# Patient Record
Sex: Male | Born: 2000 | Race: White | Hispanic: No | Smoking: Never smoker
Health system: Southern US, Community
[De-identification: ages and names within clinical notes are randomized; demographics above are authoritative.]

---

## 2020-11-14 ENCOUNTER — Other Ambulatory Visit: Payer: Self-pay

## 2020-11-14 ENCOUNTER — Encounter: Payer: Self-pay | Admitting: Emergency Medicine

## 2020-11-14 ENCOUNTER — Ambulatory Visit
Admission: EM | Admit: 2020-11-14 | Discharge: 2020-11-14 | Disposition: A | Discharge status: Home / Self Care | Payer: BC Managed Care – PPO | Attending: Family Medicine | Admitting: Family Medicine

## 2020-11-14 DIAGNOSIS — L559 Sunburn, unspecified: Secondary | ICD-10-CM | POA: Diagnosis not present

## 2020-11-14 DIAGNOSIS — B360 Pityriasis versicolor: Secondary | ICD-10-CM

## 2020-11-14 MED ORDER — SILVER SULFADIAZINE 1 % EX CREA: 1.0000 "application " | TOPICAL_CREAM | Freq: Two times a day (BID) | CUTANEOUS | 0 refills | Status: AC

## 2020-11-14 MED ORDER — FLUCONAZOLE 150 MG PO TABS
300.0000 mg | ORAL_TABLET | ORAL | 0 refills | Status: AC
Start: 2020-11-14 — End: 2020-11-28

## 2020-11-14 MED ORDER — METHYLPREDNISOLONE SODIUM SUCC 40 MG IJ SOLR
80.0000 mg | Freq: Once | INTRAMUSCULAR | Status: AC
  Administered 2020-11-14: 80 mg via INTRAMUSCULAR

## 2020-11-14 MED ORDER — HYDROXYZINE HCL 25 MG PO TABS: 25.0000 mg | ORAL_TABLET | Freq: Three times a day (TID) | ORAL | 0 refills | Status: AC | PRN

## 2020-11-14 NOTE — ED Triage Notes (Signed)
Patient states he got a bad sunburn 2 days ago and states today the itching has been unbearable over the last 2 hours.

## 2020-11-14 NOTE — ED Provider Notes (Signed)
[facility]    CSN: [id number] Arrival date & time: 11/14/20  1728      History   Chief Complaint Chief Complaint  Patient presents with  . Sunburn   HPI 20 year old male presents with the above complaint.  Patient suffered a sunburn on Saturday.  He reports that his symptoms are now worsening.  He reports severe itching to the upper back.  He states that it is almost unbearable.  Additionally, he has ongoing issues with tinea.  He uses topical treatment and has not had a complete resolution.  He states that this worsens when he gets hot and sweats.  No relieving factors.  No other associated symptoms.  No other complaints.  Home Medications    Prior to Admission medications   Medication Sig Start Date End Date Taking? Authorizing Provider  fluconazole (DIFLUCAN) 150 MG tablet Take 2 tablets (300 mg total) by mouth once a week for 14 days. Repeat dose in 72 hours. 11/14/20 11/28/20 Yes Safira Proffit G, DO  hydrOXYzine (ATARAX/VISTARIL) 25 MG tablet Take 1 tablet (25 mg total) by mouth every 8 (eight) hours as needed. 11/14/20  Yes Brice Kossman G, DO  silver sulfADIAZINE (SILVADENE) 1 % cream Apply 1 application topically 2 (two) times daily for 7 days. 11/14/20 11/21/20 Yes Tommie Sams, DO   Social History Social History   Tobacco Use  . Smoking status: Never Smoker  . Smokeless tobacco: Never Used  Vaping Use  . Vaping Use: Never used  Substance Use Topics  . Alcohol use: Yes  . Drug use: Yes    Types: Marijuana    Allergies   Patient has no known allergies.   Review of Systems Review of Systems Per HPI  Physical Exam Triage Vital Signs ED Triage Vitals  Enc Vitals Group     BP 11/14/20 1807 116/70     Pulse Rate 11/14/20 1807 66     Resp 11/14/20 1807 18     Temp 11/14/20 1807 98 F (36.7 C)     Temp Source 11/14/20 1807 Oral     SpO2 11/14/20 1807 100 %     Weight 11/14/20 1805 170 lb (77.1 kg)     Height 11/14/20 1805 5\' 9"  (1.753 m)     Head  Circumference --      Peak Flow --      Pain Score 11/14/20 1805 0     Pain Loc --      Pain Edu? --      Excl. in GC? --    Updated Vital Signs BP 116/70 (BP Location: Right Arm)   Pulse 66   Temp 98 F (36.7 C) (Oral)   Resp 18   Ht 5\' 9"  (1.753 m)   Wt 77.1 kg   SpO2 100%   BMI 25.10 kg/m   Visual Acuity Right Eye Distance:   Left Eye Distance:   Bilateral Distance:    Right Eye Near:   Left Eye Near:    Bilateral Near:     Physical Exam Vitals and nursing note reviewed.  Constitutional:      General: He is not in acute distress.    Appearance: Normal appearance. He is not ill-appearing.  HENT:     Head: Normocephalic and atraumatic.  Eyes:     General:        Right eye: No discharge.        Left eye: No discharge.     Conjunctiva/sclera: Conjunctivae normal.  Skin:    Comments: Sunburn noted to the upper back as well as the left and right shoulders.  Patient has scattered areas of erythema and hypopigmentation consistent with tinea versicolor.  Neurological:     Mental Status: He is alert.  Psychiatric:     Comments: Anxious.    UC Treatments / Results  Labs (all labs ordered are listed, but only abnormal results are displayed) Labs Reviewed - No data to display  EKG   Radiology No results found.  Procedures Procedures (including critical care time)  Medications Ordered in UC Medications  methylPREDNISolone sodium succinate (SOLU-MEDROL) 40 mg/mL injection 80 mg (80 mg Intramuscular Given 11/14/20 1836)    Initial Impression / Assessment and Plan / UC Course  I have reviewed the triage vital signs and the nursing notes.  Pertinent labs & imaging results that were available during my care of the patient were reviewed by me and considered in my medical decision making (see chart for details).    20 year old male presents with sunburn and associated itching.  Patient also has chronic tinea versicolor.  IM Solu-Medrol given today given the  severity of his itching.  Sending home on Atarax, Silvadene, and Diflucan.  Final Clinical Impressions(s) / UC Diagnoses   Final diagnoses:  Sunburn  Tinea versicolor   Discharge Instructions   None    ED Prescriptions    Medication Sig Dispense Auth. Provider   silver sulfADIAZINE (SILVADENE) 1 % cream Apply 1 application topically 2 (two) times daily for 7 days. 50 g Jager Koska G, DO   hydrOXYzine (ATARAX/VISTARIL) 25 MG tablet Take 1 tablet (25 mg total) by mouth every 8 (eight) hours as needed. 30 tablet Floella Ensz G, DO   fluconazole (DIFLUCAN) 150 MG tablet Take 2 tablets (300 mg total) by mouth once a week for 14 days. Repeat dose in 72 hours. 4 tablet Tommie Sams, DO     PDMP not reviewed this encounter.   Tommie Sams, [place] 11/14/20 2017

## 2021-01-05 ENCOUNTER — Emergency Department: Payer: BC Managed Care – PPO

## 2021-01-05 ENCOUNTER — Emergency Department
Admission: EM | Admit: 2021-01-05 | Discharge: 2021-01-05 | Disposition: A | Discharge status: Home / Self Care | Payer: BC Managed Care – PPO | Attending: Emergency Medicine | Admitting: Emergency Medicine

## 2021-01-05 ENCOUNTER — Other Ambulatory Visit: Payer: Self-pay

## 2021-01-05 DIAGNOSIS — R059 Cough, unspecified: Secondary | ICD-10-CM | POA: Diagnosis present

## 2021-01-05 DIAGNOSIS — B349 Viral infection, unspecified: Secondary | ICD-10-CM | POA: Insufficient documentation

## 2021-01-05 DIAGNOSIS — Z20822 Contact with and (suspected) exposure to covid-19: Secondary | ICD-10-CM | POA: Diagnosis not present

## 2021-01-05 DIAGNOSIS — R0602 Shortness of breath: Secondary | ICD-10-CM | POA: Diagnosis not present

## 2021-01-05 DIAGNOSIS — R599 Enlarged lymph nodes, unspecified: Secondary | ICD-10-CM | POA: Diagnosis not present

## 2021-01-05 DIAGNOSIS — L539 Erythematous condition, unspecified: Secondary | ICD-10-CM | POA: Diagnosis not present

## 2021-01-05 LAB — COMPREHENSIVE METABOLIC PANEL
ALT: 18 U/L (ref 0–44)
AST: 21 U/L (ref 15–41)
Albumin: 4 g/dL (ref 3.5–5.0)
Alkaline Phosphatase: 69 U/L (ref 38–126)
Anion gap: 9 (ref 5–15)
BUN: 8 mg/dL (ref 6–20)
CO2: 27 mmol/L (ref 22–32)
Calcium: 9.2 mg/dL (ref 8.9–10.3)
Chloride: 98 mmol/L (ref 98–111)
Creatinine, Ser: 0.94 mg/dL (ref 0.61–1.24)
GFR, Estimated: >60 mL/min (ref >60)
Glucose, Bld: 102 mg/dL — ABNORMAL HIGH (ref 70–99)
Potassium: 3.5 mmol/L (ref 3.5–5.1)
Sodium: 134 mmol/L — ABNORMAL LOW (ref 135–145)
Total Bilirubin: 0.8 mg/dL (ref 0.3–1.2)
Total Protein: 7.2 g/dL (ref 6.5–8.1)

## 2021-01-05 LAB — CBC WITH DIFFERENTIAL/PLATELET
Abs Immature Granulocytes: 0.03 10*3/uL (ref 0.00–0.07)
Basophils Absolute: 0 10*3/uL (ref 0.0–0.1)
Basophils Relative: 0 %
Eosinophils Absolute: 0.1 10*3/uL (ref 0.0–0.5)
Eosinophils Relative: 1 %
HCT: 38.3 % — ABNORMAL LOW (ref 39.0–52.0)
Hemoglobin: 12.9 g/dL — ABNORMAL LOW (ref 13.0–17.0)
Immature Granulocytes: 0 %
Lymphocytes Relative: 12 %
Lymphs Abs: 1 10*3/uL (ref 0.7–4.0)
MCH: 29.7 pg (ref 26.0–34.0)
MCHC: 33.7 g/dL (ref 30.0–36.0)
MCV: 88 fL (ref 80.0–100.0)
Monocytes Absolute: 1.4 10*3/uL — ABNORMAL HIGH (ref 0.1–1.0)
Monocytes Relative: 16 %
Neutro Abs: 6.2 10*3/uL (ref 1.7–7.7)
Neutrophils Relative %: 71 %
Platelets: 194 10*3/uL (ref 150–400)
RBC: 4.35 MIL/uL (ref 4.22–5.81)
RDW: 11.9 % (ref 11.5–15.5)
WBC: 8.7 10*3/uL (ref 4.0–10.5)
nRBC: 0 % (ref 0.0–0.2)

## 2021-01-05 LAB — RESP PANEL BY RT-PCR (FLU A&B, COVID) ARPGX2
Influenza A by PCR: NEGATIVE
Influenza B by PCR: NEGATIVE
SARS Coronavirus 2 by RT PCR: NEGATIVE

## 2021-01-05 MED ORDER — DEXAMETHASONE 10 MG/ML FOR PEDIATRIC ORAL USE: 10.0000 mg | Freq: Once | INTRAMUSCULAR | Status: AC

## 2021-01-05 MED ORDER — DEXAMETHASONE 6 MG PO TABS
10.0000 mg | ORAL_TABLET | Freq: Once | ORAL | Status: DC
  Filled 2021-01-05: qty 1

## 2021-01-05 MED ORDER — DEXAMETHASONE 1 MG/ML PO CONC: 10.0000 mg | Freq: Once | ORAL | Status: DC

## 2021-01-05 MED ORDER — DEXAMETHASONE 10 MG/ML FOR PEDIATRIC ORAL USE
INTRAMUSCULAR | Status: AC
  Administered 2021-01-05: 10 mg via ORAL
  Filled 2021-01-05: qty 1

## 2021-01-05 NOTE — ED Provider Notes (Signed)
[facility] Emergency Department Provider Note  ____________________________________________   Event Date/Time   First MD Initiated Contact with Patient 01/05/21 2104     (approximate)  I have reviewed the triage vital signs and the nursing notes.   HISTORY  Chief Complaint Shortness of Breath   HPI Daniel Curtis is a 20 y.o. male with a past medical history of asthma who presents for assessment approximately 5 days of cough with productive green sputum, congestion, sore throat, chills, shortness of breath and diarrhea.  No earache, headache, specific chest pain, abdominal pain, back pain, urinary symptoms, rash or extremity weakness numbness or tingling.  No recent injuries or falls.  States he went to a clinic on his campus 2 days ago and was tested for strep and COVID and these tests were negative.  Has been taking DayQuil and NyQuil but is still feeling bad.  No other acute concerns at this time.         No past medical history on file.  There are no problems to display for this patient.   No past surgical history on file.  Prior to Admission medications   Medication Sig Start Date End Date Taking? Authorizing Provider  hydrOXYzine (ATARAX/VISTARIL) 25 MG tablet Take 1 tablet (25 mg total) by mouth every 8 (eight) hours as needed. 11/14/20   Tommie Sams, DO    Allergies Patient has no known allergies.  No family history on file.  Social History Social History   Tobacco Use  . Smoking status: Never Smoker  . Smokeless tobacco: Never Used  Vaping Use  . Vaping Use: Never used  Substance Use Topics  . Alcohol use: Yes  . Drug use: Yes    Types: Marijuana    Review of Systems  Review of Systems  Constitutional: Positive for chills, diaphoresis and malaise/fatigue. Negative for fever.  HENT: Positive for congestion and sore throat.   Eyes: Negative for pain.  Respiratory: Positive for cough and shortness of breath. Negative for  stridor.   Cardiovascular: Negative for chest pain.  Gastrointestinal: Positive for diarrhea. Negative for vomiting.  Genitourinary: Negative for dysuria.  Musculoskeletal: Negative for myalgias.  Skin: Negative for rash.  Neurological: Negative for seizures, loss of consciousness and headaches.  Psychiatric/Behavioral: Negative for suicidal ideas.  All other systems reviewed and are negative.     ____________________________________________   PHYSICAL EXAM:  VITAL SIGNS: ED Triage Vitals [01/05/21 2101]  Enc Vitals Group     BP (!) 140/96     Pulse Rate 76     Resp 20     Temp 98.3 F (36.8 C)     Temp Source Oral     SpO2 98 %     Weight 165 lb (74.8 kg)     Height 5\' 9"  (1.753 m)     Head Circumference      Peak Flow      Pain Score 3     Pain Loc      Pain Edu?      Excl. in GC?    Vitals:   01/05/21 2101 01/05/21 2245  BP: (!) 140/96   Pulse: 76   Resp: 20 17  Temp: 98.3 F (36.8 C)   SpO2: 98%    Physical Exam Vitals and nursing note reviewed.  Constitutional:      Appearance: He is well-developed.  HENT:     Head: Normocephalic and atraumatic.     Right Ear: External ear normal.  Left Ear: External ear normal.     Nose: Nose normal.     Mouth/Throat:     Pharynx: Posterior oropharyngeal erythema present.  Eyes:     Conjunctiva/sclera: Conjunctivae normal.  Cardiovascular:     Rate and Rhythm: Normal rate and regular rhythm.     Heart sounds: No murmur heard.   Pulmonary:     Effort: Pulmonary effort is normal. No respiratory distress.     Breath sounds: Normal breath sounds. No decreased breath sounds or wheezing.  Abdominal:     Palpations: Abdomen is soft.     Tenderness: There is no abdominal tenderness.  Musculoskeletal:     Cervical back: Neck supple. No rigidity.  Lymphadenopathy:     Cervical: Cervical adenopathy present.  Skin:    General: Skin is warm and dry.     Capillary Refill: Capillary refill takes less than 2  seconds.  Neurological:     Mental Status: He is alert and oriented to person, place, and time.  Psychiatric:        Mood and Affect: Mood normal.     Nonselectively enlarged but there is no exudates uvular deviation.  Patient has no trismus.  No stridor over the neck or limitation range of motion at the neck.  Some bilateral anterior cervical lymphadenopathy ____________________________________________   LABS (all labs ordered are listed, but only abnormal results are displayed)  Labs Reviewed  CBC WITH DIFFERENTIAL/PLATELET - Abnormal; Notable for the following components:      Result Value   Hemoglobin 12.9 (*)    HCT 38.3 (*)    Monocytes Absolute 1.4 (*)    All other components within normal limits  COMPREHENSIVE METABOLIC PANEL - Abnormal; Notable for the following components:   Sodium 134 (*)    Glucose, Bld 102 (*)    All other components within normal limits  RESP PANEL BY RT-PCR (FLU A&B, COVID) [id number]   ____________________________________________  EKG  Sinus rhythm at a ventricular rate of 60, normal axis, unremarkable intervals with isolated nonspecific change in lead III without other clearance of acute ischemia or other significant Arrhythmia. ____________________________________________  RADIOLOGY  ED MD interpretation: No focal consolidation, effusion, edema, pneumothorax or any other clear acute intrathoracic process.  Official radiology report(s): DG Chest Portable 1 View  Result Date: 01/05/2021 CLINICAL DATA:  Shortness of breath EXAM: PORTABLE CHEST 1 VIEW COMPARISON:  None. FINDINGS: The heart size and mediastinal contours are within normal limits. Both lungs are clear. The visualized skeletal structures are unremarkable. IMPRESSION: No active disease. Electronically Signed   By: Jasmine Pang M.D.   On: 01/05/2021 21:25    ____________________________________________   PROCEDURES  Procedure(s) performed (including Critical  Care):  Procedures   ____________________________________________   INITIAL IMPRESSION / ASSESSMENT AND PLAN / ED COURSE      Patient presents with above to history exam for assessment of approximately 4 to 5 days of shortness of breath, productive cough, sore throat, congestion, chills and nonbloody diarrhea.  He states he had a negative COVID and strep test 2 or 3 days ago.  On arrival he is afebrile and hemodynamically stable.  Differential includes bacterial pneumonia, viral infection with component of bronchitis and enteritis, myocarditis, pericarditis, ACS, PE, and metabolic derangements.  Patient does not appear septic or meningitic and there is no evidence on exam of the space infection in the head or neck.  Patient is actually PERC negative and I have a low suspicion for PE at this time.  Chest x-ray has no evidence of focal consolidation, effusion, edema, pneumothorax or any other clear acute thoracic process.  ECG has isolated nonspecific change but given absence of any history of chest pain Evalose patient for ACS or myocarditis.  No abnormal intervals or arrhythmia.  CMP shows no significant electrolyte or metabolic derangements.  CBC shows no leukocytosis or acute anemia.  COVID and influenza test is negative.  Overall have a low suspicion for sepsis meningitis or serious invasive bacterial infection at this time.  Suspect likely acute viral syndrome with component of bronchitis pharyngitis and enteritis.  Given stable vitals otherwise reassuring exam work-up and patient tolerating p.o. I think is safe for discharge with outpatient follow-up.  He was given a dose of Decadron for sore throat emergency room.  Discharge stable condition.  Strict return precautions advised and discussed.       ____________________________________________   FINAL CLINICAL IMPRESSION(S) / ED DIAGNOSES  Final diagnoses:  Viral illness    Medications  dexamethasone (DECADRON) 1 MG/ML solution  10 mg (10 mg Oral Not Given 01/05/21 2256)  dexamethasone (DECADRON) 10 MG/ML injection for Pediatric ORAL use 10 mg (10 mg Oral Given 01/05/21 2256)     ED Discharge Orders    None       Note:  This document was prepared using Dragon voice recognition software and may include unintentional dictation errors.   Gilles Chiquito, MD 01/05/21 [phone]

## 2021-01-05 NOTE — ED Triage Notes (Signed)
Pt in with co body aches, coughing greenish sputum and congestion. Saw [facility] and had negative covid and strep test. Here for worsening sob, also has diarrhea for 3 days.

## 2022-05-08 IMAGING — DX DG CHEST 1V PORT
1 series · 1 of 1 positions shown · non-contrast
Comparison: None.

CLINICAL DATA: Shortness of breath

EXAM:
PORTABLE CHEST 1 VIEW

[chest ap]
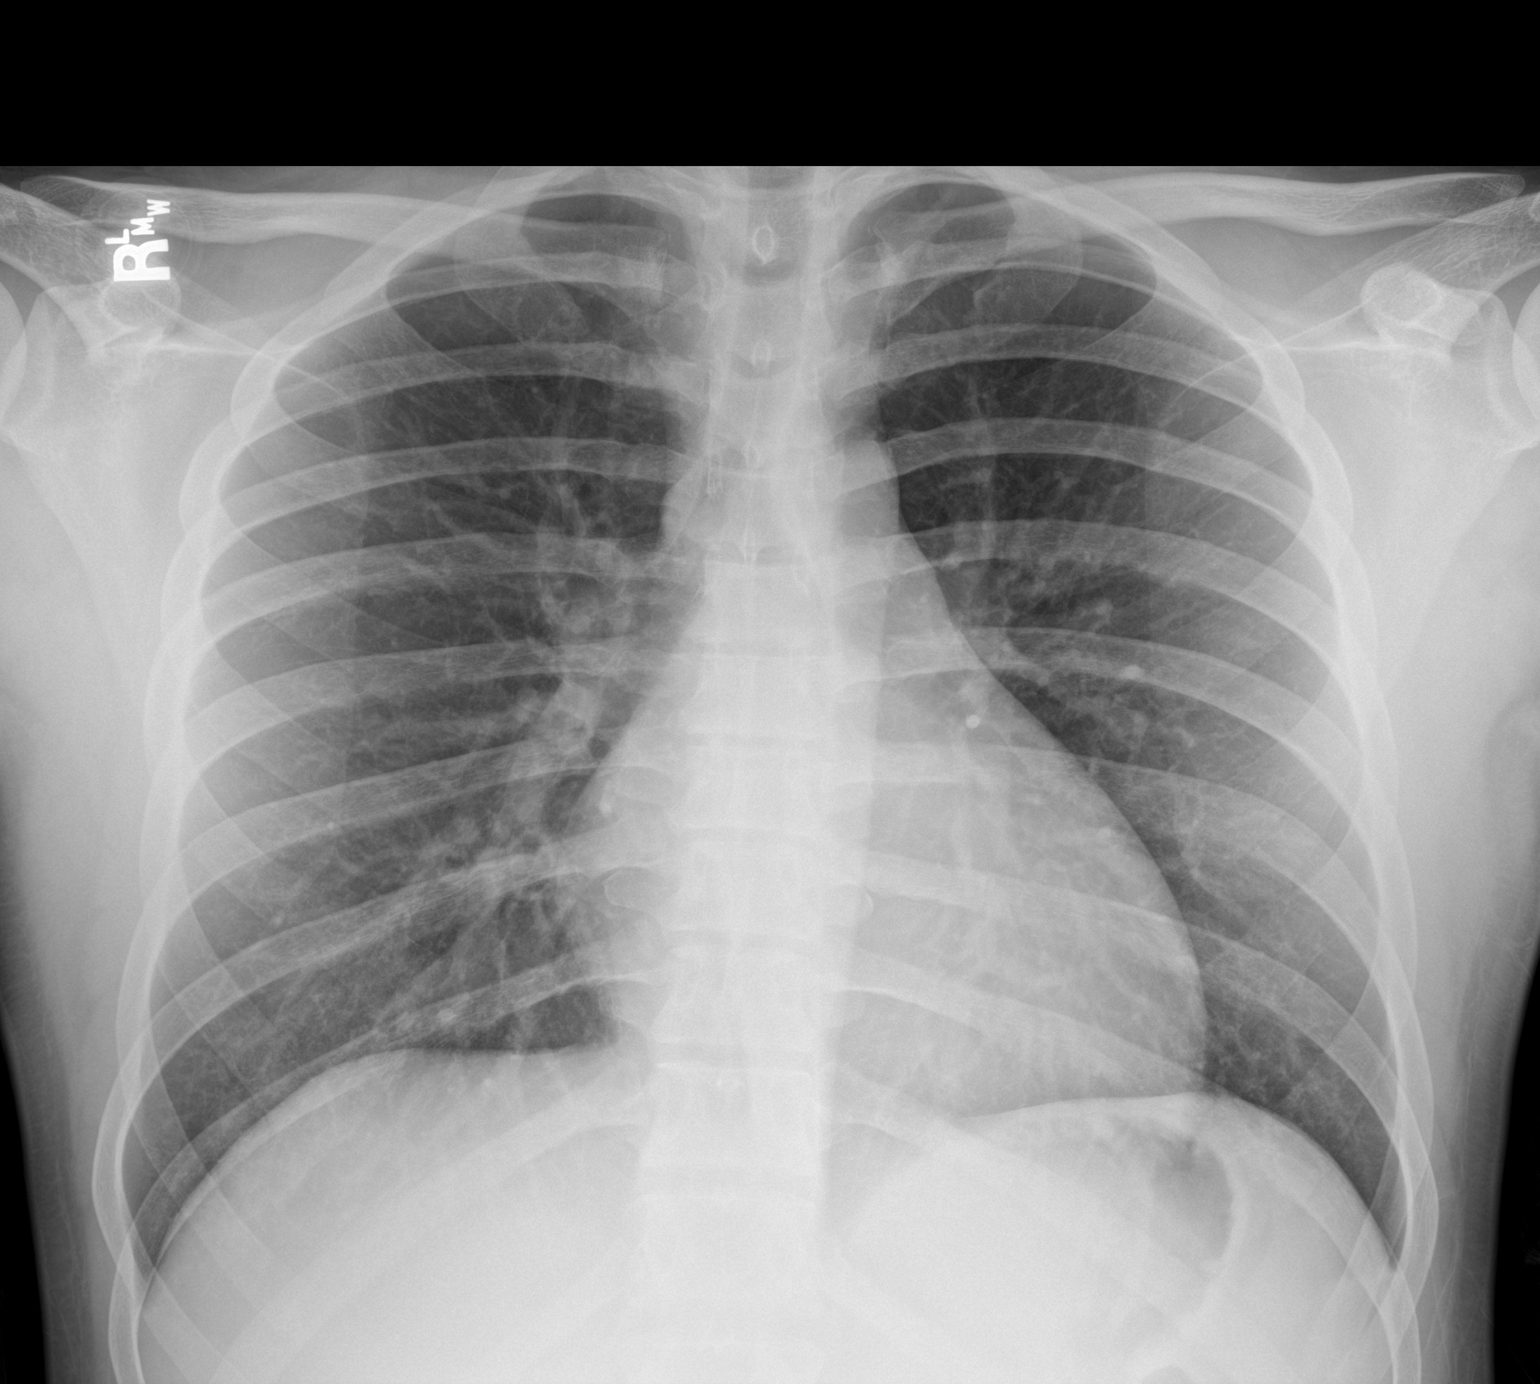

[1 of 1 positions shown; findings below may reference images not displayed]

FINDINGS: The heart size and mediastinal contours are within normal limits.
Both lungs are clear. The visualized skeletal structures are
unremarkable.
IMPRESSION: No active disease.
